# Patient Record
Sex: Male | Born: 1940 | Race: White | Hispanic: No | Marital: Married | Smoking: Never smoker
Health system: Southern US, Community
[De-identification: ages and names within clinical notes are randomized; demographics above are authoritative.]

## PROBLEM LIST (undated history)

## (undated) DIAGNOSIS — I10 Essential (primary) hypertension: Secondary | ICD-10-CM

---

## 2018-01-17 ENCOUNTER — Emergency Department (HOSPITAL_COMMUNITY): Payer: Medicare (Managed Care)

## 2018-01-17 ENCOUNTER — Emergency Department (HOSPITAL_COMMUNITY)
Admission: EM | Admit: 2018-01-17 | Discharge: 2018-01-17 | Disposition: A | Discharge status: Home / Self Care | Payer: Medicare (Managed Care) | Attending: Emergency Medicine | Admitting: Emergency Medicine

## 2018-01-17 ENCOUNTER — Encounter (HOSPITAL_COMMUNITY): Payer: Self-pay

## 2018-01-17 DIAGNOSIS — J181 Lobar pneumonia, unspecified organism: Secondary | ICD-10-CM | POA: Diagnosis not present

## 2018-01-17 DIAGNOSIS — R05 Cough: Secondary | ICD-10-CM | POA: Diagnosis present

## 2018-01-17 DIAGNOSIS — I10 Essential (primary) hypertension: Secondary | ICD-10-CM | POA: Diagnosis not present

## 2018-01-17 DIAGNOSIS — J189 Pneumonia, unspecified organism: Secondary | ICD-10-CM

## 2018-01-17 HISTORY — DX: Essential (primary) hypertension: I10

## 2018-01-17 LAB — CBC WITH DIFFERENTIAL/PLATELET
Abs Immature Granulocytes: 0.1 10*3/uL (ref 0.0–0.1)
BASOS ABS: 0.1 10*3/uL (ref 0.0–0.1)
Basophils Relative: 1 %
EOS ABS: 0.2 10*3/uL (ref 0.0–0.7)
EOS PCT: 3 %
HCT: 40.2 % (ref 39.0–52.0)
HEMOGLOBIN: 13 g/dL (ref 13.0–17.0)
IMMATURE GRANULOCYTES: 1 %
Lymphocytes Relative: 20 %
Lymphs Abs: 1.4 10*3/uL (ref 0.7–4.0)
MCH: 28.5 pg (ref 26.0–34.0)
MCHC: 32.3 g/dL (ref 30.0–36.0)
MCV: 88.2 fL (ref 78.0–100.0)
Monocytes Absolute: 0.7 10*3/uL (ref 0.1–1.0)
Monocytes Relative: 10 %
Neutro Abs: 4.8 10*3/uL (ref 1.7–7.7)
Neutrophils Relative %: 65 %
Platelets: 207 10*3/uL (ref 150–400)
RBC: 4.56 MIL/uL (ref 4.22–5.81)
RDW: 12.9 % (ref 11.5–15.5)
WBC: 7.1 10*3/uL (ref 4.0–10.5)

## 2018-01-17 LAB — COMPREHENSIVE METABOLIC PANEL
ALT: 17 U/L (ref 0–44)
ANION GAP: 9 (ref 5–15)
AST: 22 U/L (ref 15–41)
Albumin: 3 g/dL — ABNORMAL LOW (ref 3.5–5.0)
Alkaline Phosphatase: 52 U/L (ref 38–126)
BUN: 24 mg/dL — ABNORMAL HIGH (ref 8–23)
CO2: 24 mmol/L (ref 22–32)
Calcium: 8.8 mg/dL — ABNORMAL LOW (ref 8.9–10.3)
Chloride: 104 mmol/L (ref 98–111)
Creatinine, Ser: 0.98 mg/dL (ref 0.61–1.24)
GFR calc Af Amer: >60 mL/min (ref >60)
GFR calc non Af Amer: >60 mL/min (ref >60)
GLUCOSE: 114 mg/dL — AB (ref 70–99)
POTASSIUM: 3.9 mmol/L (ref 3.5–5.1)
SODIUM: 137 mmol/L (ref 135–145)
Total Bilirubin: 0.8 mg/dL (ref 0.3–1.2)
Total Protein: 6.7 g/dL (ref 6.5–8.1)

## 2018-01-17 LAB — I-STAT CG4 LACTIC ACID, ED: Lactic Acid, Venous: 1.29 mmol/L (ref 0.5–1.9)

## 2018-01-17 MED ORDER — LEVOFLOXACIN 250 MG PO TABS: 750.0000 mg | ORAL_TABLET | Freq: Every day | ORAL | 0 refills | Status: AC

## 2018-01-17 MED ORDER — GUAIFENESIN-CODEINE 100-10 MG/5ML PO SOLN: 5.0000 mL | Freq: Four times a day (QID) | ORAL | 0 refills | Status: DC | PRN

## 2018-01-17 MED ORDER — BENZONATATE 100 MG PO CAPS: 100.0000 mg | ORAL_CAPSULE | Freq: Three times a day (TID) | ORAL | 0 refills | Status: AC | PRN

## 2018-01-17 MED ORDER — LEVOFLOXACIN IN D5W 750 MG/150ML IV SOLN
750.0000 mg | Freq: Once | INTRAVENOUS | Status: AC
  Administered 2018-01-17: 750 mg via INTRAVENOUS
  Filled 2018-01-17: qty 150

## 2018-01-17 MED ORDER — ALBUTEROL SULFATE HFA 108 (90 BASE) MCG/ACT IN AERS: 1.0000 | INHALATION_SPRAY | Freq: Four times a day (QID) | RESPIRATORY_TRACT | 0 refills | Status: AC | PRN

## 2018-01-17 NOTE — Discharge Instructions (Addendum)
You have pneumonia. I gave you a prescription for an antibiotic (Levofloxacin). Please get this filled and take it twice a day until it is gone (5 day course).   I also gave you a prescription for some cough capsules. This medicine might make you feel a little drowsy.   I also gave you a prescription for an albuterol inhaler. You can use this as needed for shortness of breath or cough. Albuterol can increase your heart rate and make you feel jittery. If these symptoms are very bothersome, stop taking albuterol and let the effects wear off.   If your symptoms worsen, go to the nearest urgent care or emergency department.

## 2018-01-17 NOTE — ED Provider Notes (Signed)
[facility] EMERGENCY DEPARTMENT Provider Note   CSN: [id number] Arrival date & time: 01/17/18  1227     History   Chief Complaint Cough  HPI Kevin Key is a 77 y.o. male with dementia and HTN presenting with 6 days of nonproductive cough and congestion. Wife is at bedside and assists with history as husband's cognition is slightly delayed. She states that they are traveling from [place] to [place]. Kevin Key endorses malaise, chills. Cough is worse at night. He feels congested in his chest. He had one episode of aspiration last night when drinking water. The wife states that his face turned red and he became diaphoretic. She was slapping him on the back and his symptoms resolved.   His wife has been giving him pseudoephedrine, mucinex, and cough syrup with no improvement. She has also been giving him Amoxicillin 500mg  BID for 5 days.   Denies fevers, n/v/d, chest pain, sob, dysuria, abdominal pain. No history of lung disease, diabetes or previous pneumonias.   HPI  Past Medical History:  Diagnosis Date  . Hypertension     There are no active problems to display for this patient.   History reviewed. No pertinent surgical history.   Meds: Lisinopril Donepezil    Home Medications    Prior to Admission medications   Medication Sig Start Date End Date Taking? Authorizing Provider  albuterol (PROVENTIL HFA;VENTOLIN HFA) 108 (90 Base) MCG/ACT inhaler Inhale 1-2 puffs into the lungs every 6 (six) hours as needed for wheezing or shortness of breath. 01/17/18   Ali Lowe, MD  guaiFENesin-codeine 100-10 MG/5ML syrup Take 5 mLs by mouth every 6 (six) hours as needed for cough. 01/17/18   Ali Lowe, MD  levofloxacin (LEVAQUIN) 250 MG tablet Take 3 tablets (750 mg total) by mouth daily for 5 days. 01/17/18 01/22/18  Ali Lowe, MD    Family History No family history on file.  Social History Social History   Tobacco Use  . Smoking status: Never  Smoker  . Smokeless tobacco: Never Used  Substance Use Topics  . Alcohol use: Not on file  . Drug use: Not on file     Allergies   Patient has no known allergies.   Review of Systems Review of Systems  See HPI  Physical Exam Updated Vital Signs BP 113/73   Pulse 97   Temp 98.4 F (36.9 C) (Oral)   Resp 18   SpO2 94%   Physical Exam  Vitals:   01/17/18 1244  BP: 113/73  Pulse: 97  Resp: 18  Temp: 98.4 F (36.9 C)  TempSrc: Oral  SpO2: 94%   General: Vital signs reviewed.  Patient is well-developed and well-nourished, in no acute distress and cooperative with exam.  Eyes: EOMI, conjunctivae normal, no scleral icterus.  Ears: tympanic membranes clear with good light reflexes. Mouth: oropharynx without erythema or exudate, good dentition  Cardiovascular: RRR, S1 normal, S2 normal, no murmurs, gallops, or rubs. Pulmonary/Chest: Slight crackles at bilateral bases. Superior lung fields are CTAB, no wheezing. Breathing comfortable on room air. Able to speak full sentences. Occasional coughing spells during exam.  Extremities: No lower extremity edema bilaterally. Skin warm, dry  Psychiatric: Normal mood and affect. speech and behavior is normal. Cognition and memory are slightly delayed. Wife assisted with much of the history   ED Treatments / Results  Labs (all labs ordered are listed, but only abnormal results are displayed) Labs Reviewed  COMPREHENSIVE METABOLIC PANEL - Abnormal; Notable  for the following components:      Result Value   Glucose, Bld 114 (*)    BUN 24 (*)    Calcium 8.8 (*)    Albumin 3.0 (*)    All other components within normal limits  CBC WITH DIFFERENTIAL/PLATELET  I-STAT CG4 LACTIC ACID, ED  I-STAT CG4 LACTIC ACID, ED    EKG None  Radiology Dg Chest 2 View  Result Date: 01/17/2018 CLINICAL DATA:  Cough and congestion EXAM: CHEST - 2 VIEW COMPARISON:  None. FINDINGS: Mild infiltrate in the left base. No pneumothorax. The heart,  hila, mediastinum, lungs, and pleura are otherwise unremarkable. IMPRESSION: Mild infiltrate in left base could represent pneumonia versus atelectasis. Recommend follow-up to resolution. Electronically Signed   By: Gerome Sam III M.D   On: 01/17/2018 13:06    Procedures Procedures (including critical care time)  Medications Ordered in ED Medications  levofloxacin (LEVAQUIN) IVPB 750 mg (750 mg Intravenous New Bag/Given 01/17/18 1529)     Initial Impression / Assessment and Plan / ED Course  I have reviewed the triage vital signs and the nursing notes.  Pertinent labs & imaging results that were available during my care of the patient were reviewed by me and considered in my medical decision making (see chart for details).   77yo male with dementia and HTN presenting with 6 days of nonproductive cough, chest congestion, malaise, chills. Mild crackles at bilateral bases on lung exam, no wheezing. Oropharynx clear without exudate. CXR shows possible left lower lobe infiltrate vs. Atelectasis. Doubt PE since he is not hypoxic, tachycardic, and  denies pleuritic chest pain. He does have recent travel but his symptoms started before his travels. Most likely community acquired pneumonia. Left lower lobe is an atypical location for aspiration pneumonia. No recent hospitalizations or illness. Recent antibiotic use of amoxicillin.   Lactate normal. Renal function normal. CBC without leukocytosis.   Breathing comfortably on room air. Patient is safe to discharge with a 5 day course of levaquin 750mg  qd, albuterol inhaler, and tessalon capsules.     Final Clinical Impressions(s) / ED Diagnoses   Final diagnoses:  Community acquired pneumonia of left lower lobe of lung [facility]    ED Discharge Orders         Ordered    levofloxacin (LEVAQUIN) 250 MG tablet  Daily     01/17/18 1520    guaiFENesin-codeine 100-10 MG/5ML syrup  Every 6 hours PRN     01/17/18 1520    albuterol (PROVENTIL  HFA;VENTOLIN HFA) 108 (90 Base) MCG/ACT inhaler  Every 6 hours PRN     01/17/18 1520           Ali Lowe, MD 01/17/18 1539    Charlynne Pander, MD 01/19/18 2137

## 2018-01-17 NOTE — ED Triage Notes (Signed)
Patient complains of cough and congestion x 6 days, cough worse at night. Taking otc meds with minimal relief, No distress

## 2019-03-10 IMAGING — DX DG CHEST 2V
2 series · 2 of 2 positions shown · non-contrast
Comparison: None.

CLINICAL DATA: Cough and congestion

EXAM:
CHEST - 2 VIEW

[chest pa]
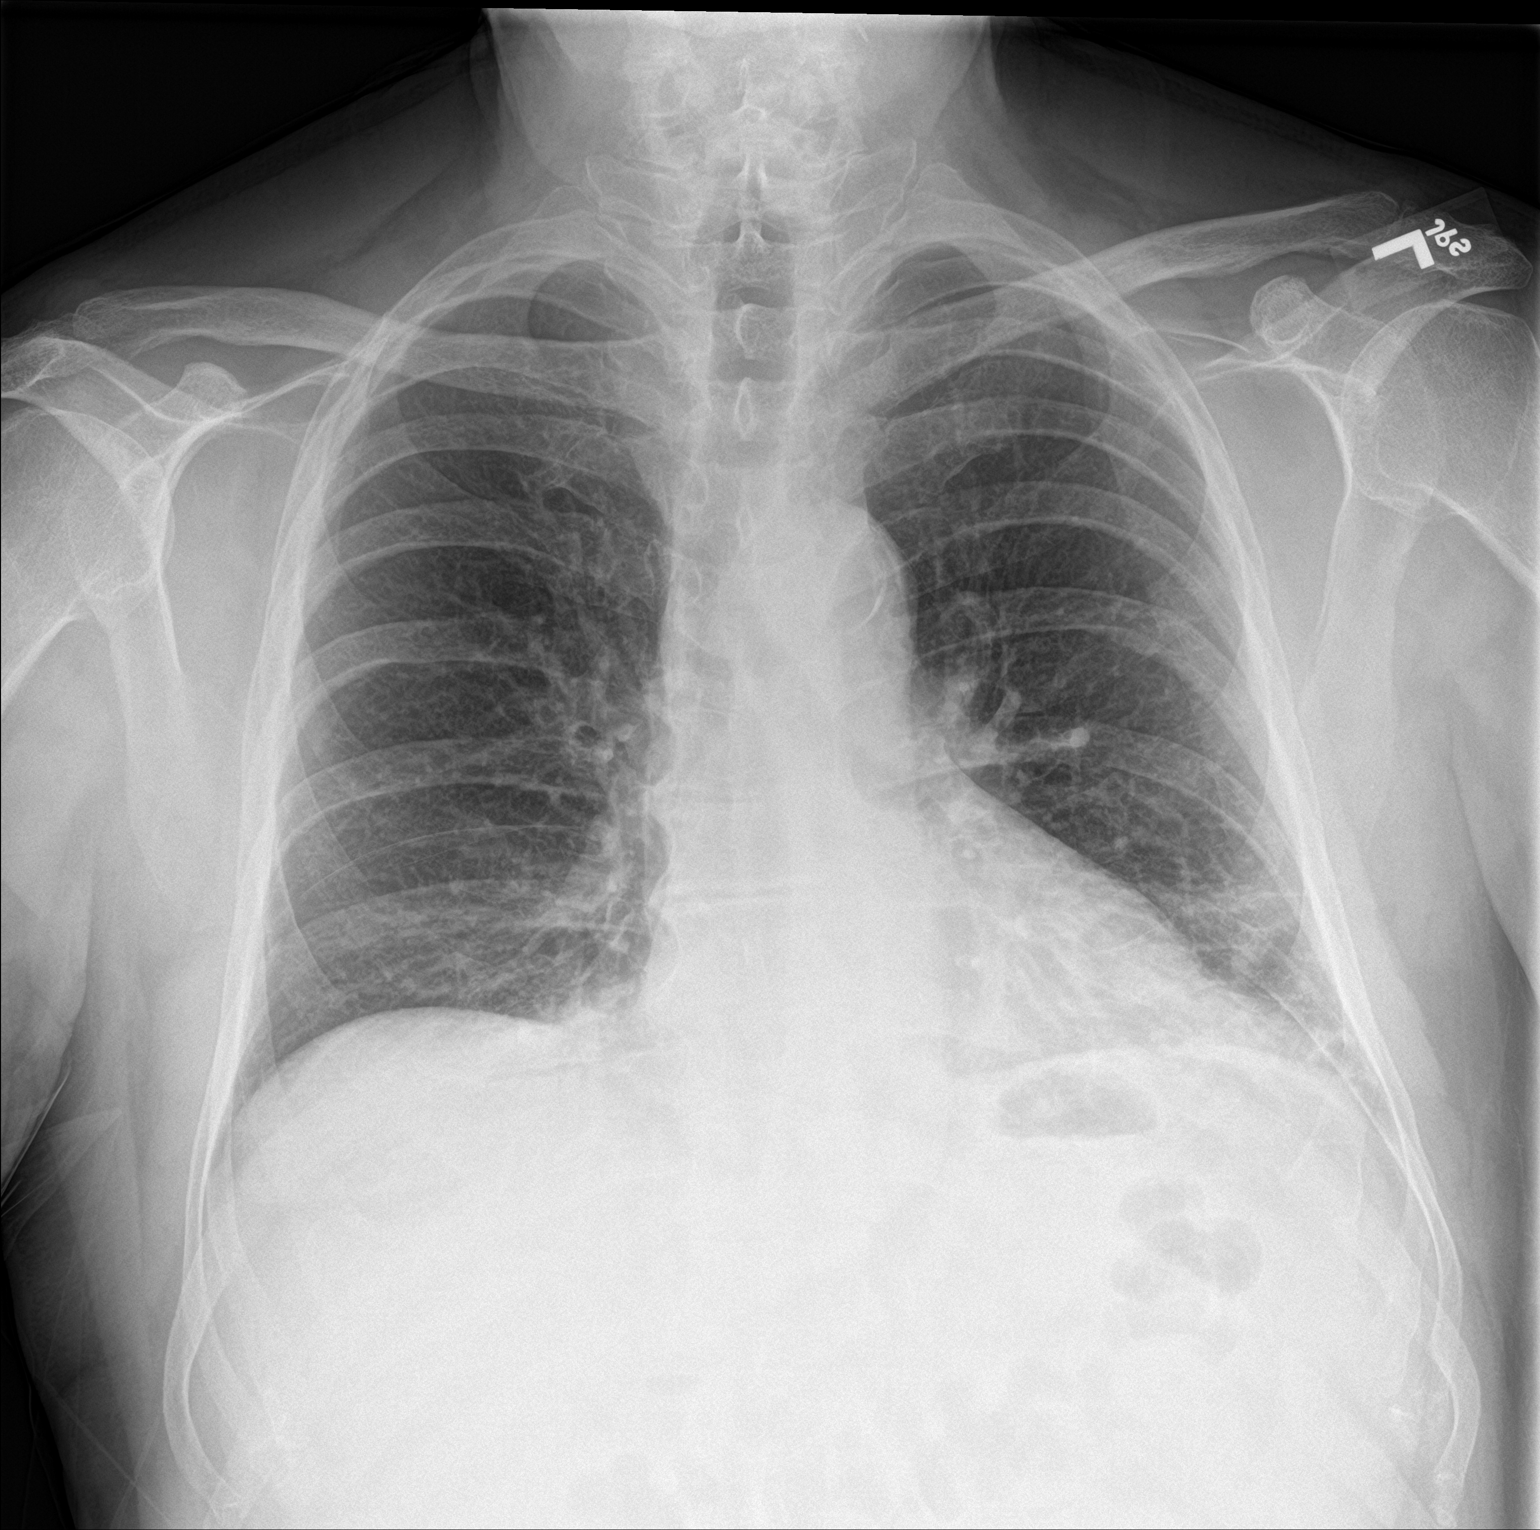

[chest lat]
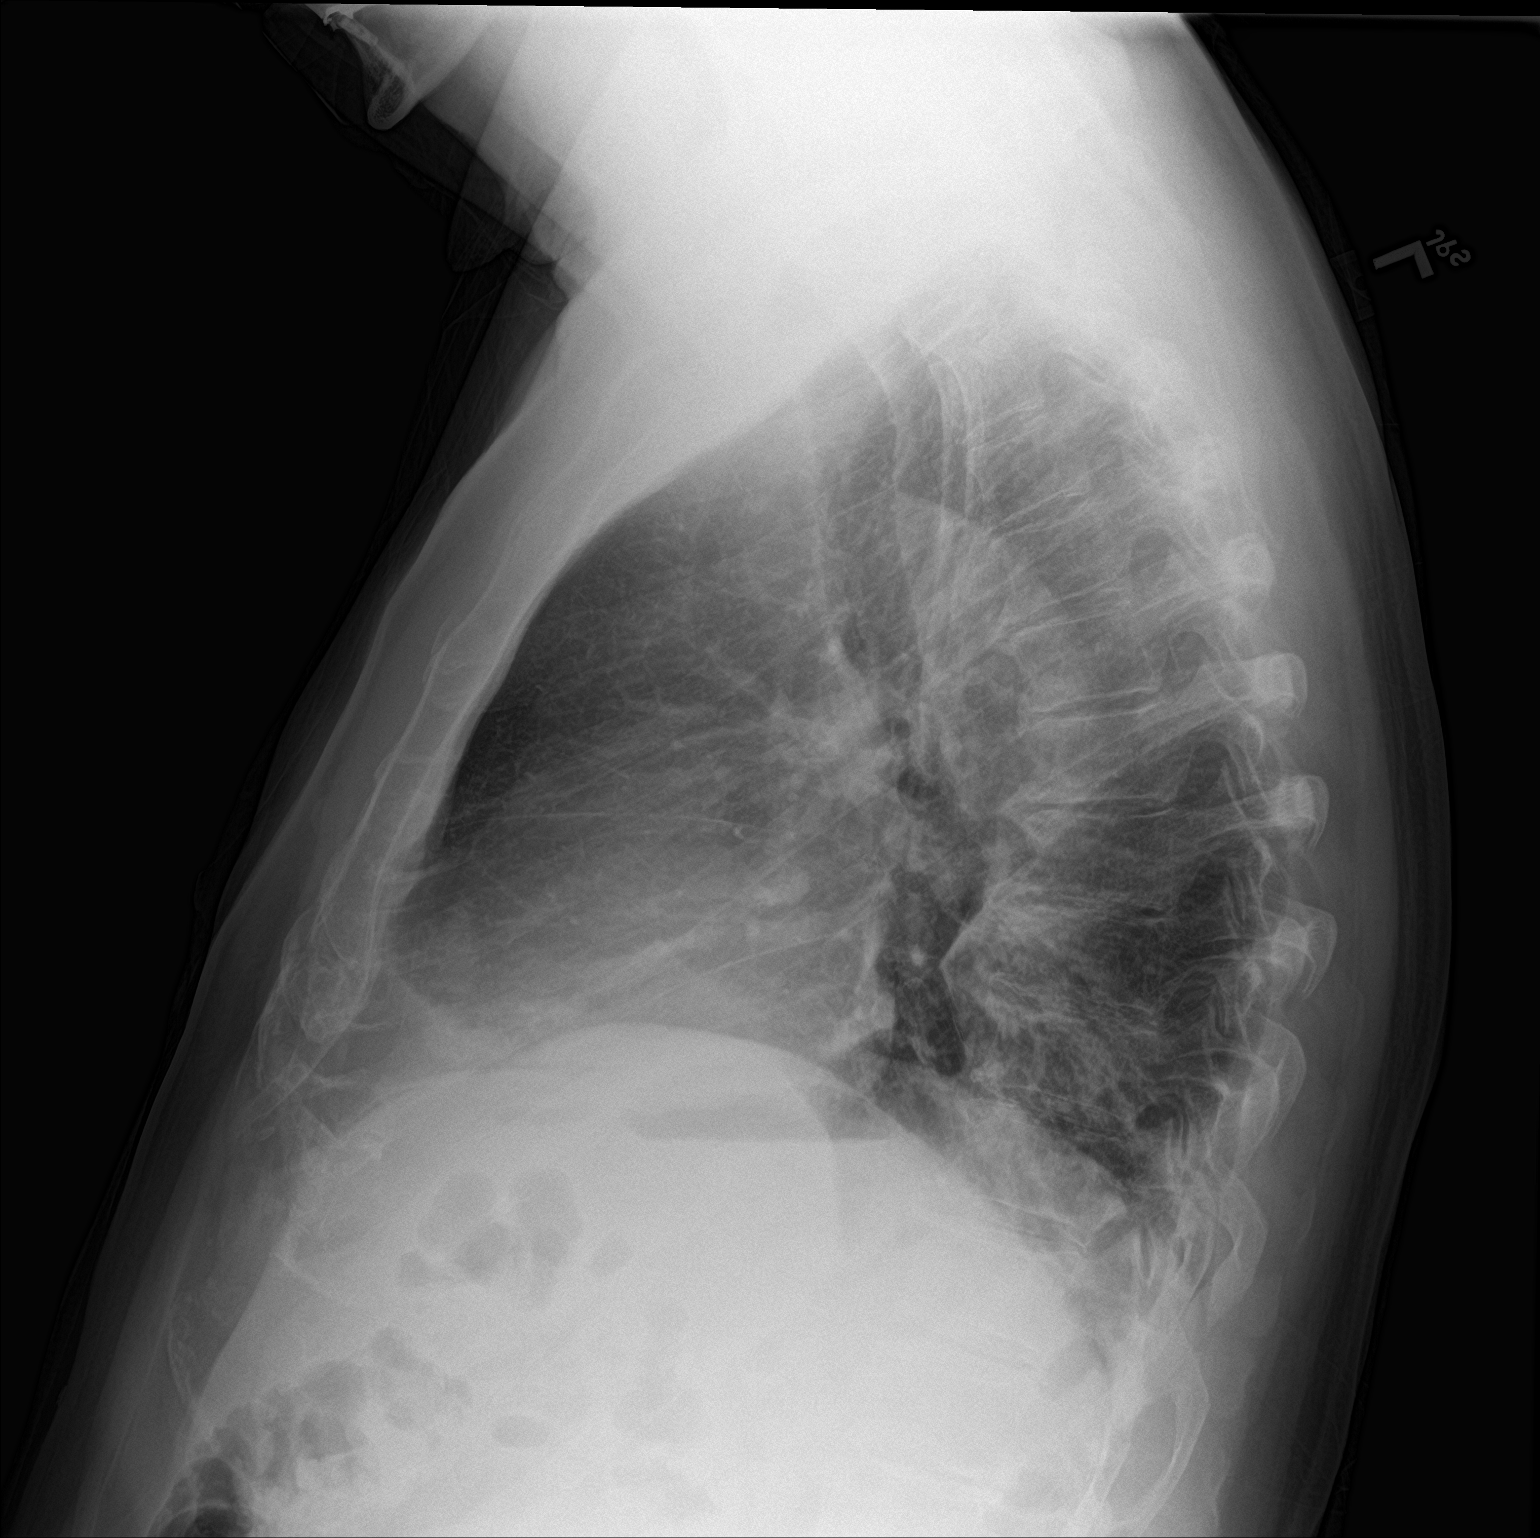

[2 of 2 positions shown; findings below may reference images not displayed]

FINDINGS: Mild infiltrate in the left base. No pneumothorax. The heart, hila,
mediastinum, lungs, and pleura are otherwise unremarkable.
IMPRESSION: Mild infiltrate in left base could represent pneumonia versus
atelectasis. Recommend follow-up to resolution.
